# Patient Record
Sex: Male | Born: 2000 | Race: Black or African American | Hispanic: No | Marital: Single | Smoking: Never smoker
Health system: Southern US, Community
[De-identification: ages and names within clinical notes are randomized; demographics above are authoritative.]

---
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
## 2021-01-31 DIAGNOSIS — Z419 Encounter for procedure for purposes other than remedying health state, unspecified: Secondary | ICD-10-CM | POA: Diagnosis not present

## 2021-03-03 DIAGNOSIS — Z419 Encounter for procedure for purposes other than remedying health state, unspecified: Secondary | ICD-10-CM | POA: Diagnosis not present

## 2021-03-31 DIAGNOSIS — Z419 Encounter for procedure for purposes other than remedying health state, unspecified: Secondary | ICD-10-CM | POA: Diagnosis not present

## 2021-05-01 DIAGNOSIS — Z419 Encounter for procedure for purposes other than remedying health state, unspecified: Secondary | ICD-10-CM | POA: Diagnosis not present

## 2021-05-31 DIAGNOSIS — Z419 Encounter for procedure for purposes other than remedying health state, unspecified: Secondary | ICD-10-CM | POA: Diagnosis not present

## 2021-07-01 DIAGNOSIS — Z419 Encounter for procedure for purposes other than remedying health state, unspecified: Secondary | ICD-10-CM | POA: Diagnosis not present

## 2021-07-31 DIAGNOSIS — Z419 Encounter for procedure for purposes other than remedying health state, unspecified: Secondary | ICD-10-CM | POA: Diagnosis not present

## 2021-08-31 DIAGNOSIS — Z419 Encounter for procedure for purposes other than remedying health state, unspecified: Secondary | ICD-10-CM | POA: Diagnosis not present

## 2021-10-01 DIAGNOSIS — Z419 Encounter for procedure for purposes other than remedying health state, unspecified: Secondary | ICD-10-CM | POA: Diagnosis not present

## 2021-10-31 DIAGNOSIS — Z419 Encounter for procedure for purposes other than remedying health state, unspecified: Secondary | ICD-10-CM | POA: Diagnosis not present

## 2021-12-01 DIAGNOSIS — Z419 Encounter for procedure for purposes other than remedying health state, unspecified: Secondary | ICD-10-CM | POA: Diagnosis not present

## 2021-12-31 DIAGNOSIS — Z419 Encounter for procedure for purposes other than remedying health state, unspecified: Secondary | ICD-10-CM | POA: Diagnosis not present

## 2022-01-31 DIAGNOSIS — Z419 Encounter for procedure for purposes other than remedying health state, unspecified: Secondary | ICD-10-CM | POA: Diagnosis not present

## 2022-03-03 DIAGNOSIS — Z419 Encounter for procedure for purposes other than remedying health state, unspecified: Secondary | ICD-10-CM | POA: Diagnosis not present

## 2022-04-01 DIAGNOSIS — Z419 Encounter for procedure for purposes other than remedying health state, unspecified: Secondary | ICD-10-CM | POA: Diagnosis not present

## 2022-05-02 DIAGNOSIS — Z419 Encounter for procedure for purposes other than remedying health state, unspecified: Secondary | ICD-10-CM | POA: Diagnosis not present

## 2022-06-01 DIAGNOSIS — Z419 Encounter for procedure for purposes other than remedying health state, unspecified: Secondary | ICD-10-CM | POA: Diagnosis not present

## 2022-07-02 DIAGNOSIS — Z419 Encounter for procedure for purposes other than remedying health state, unspecified: Secondary | ICD-10-CM | POA: Diagnosis not present

## 2022-08-01 DIAGNOSIS — Z419 Encounter for procedure for purposes other than remedying health state, unspecified: Secondary | ICD-10-CM | POA: Diagnosis not present

## 2022-08-08 DIAGNOSIS — S0502XA Injury of conjunctiva and corneal abrasion without foreign body, left eye, initial encounter: Secondary | ICD-10-CM | POA: Diagnosis not present

## 2022-08-08 DIAGNOSIS — S0501XA Injury of conjunctiva and corneal abrasion without foreign body, right eye, initial encounter: Secondary | ICD-10-CM | POA: Diagnosis not present

## 2022-09-01 DIAGNOSIS — Z419 Encounter for procedure for purposes other than remedying health state, unspecified: Secondary | ICD-10-CM | POA: Diagnosis not present

## 2022-10-02 DIAGNOSIS — Z419 Encounter for procedure for purposes other than remedying health state, unspecified: Secondary | ICD-10-CM | POA: Diagnosis not present

## 2022-11-01 DIAGNOSIS — Z419 Encounter for procedure for purposes other than remedying health state, unspecified: Secondary | ICD-10-CM | POA: Diagnosis not present

## 2022-12-02 DIAGNOSIS — Z419 Encounter for procedure for purposes other than remedying health state, unspecified: Secondary | ICD-10-CM | POA: Diagnosis not present

## 2023-01-01 DIAGNOSIS — Z419 Encounter for procedure for purposes other than remedying health state, unspecified: Secondary | ICD-10-CM | POA: Diagnosis not present

## 2023-01-24 IMAGING — DX DG HAND COMPLETE 3+V*R*
3 series · 3 of 3 positions shown · non-contrast
Comparison: None.

CLINICAL DATA: Punched door last night. Hand pain and swelling.
Initial encounter.

EXAM:
RIGHT HAND - COMPLETE 3+ VIEW

[hand pa]
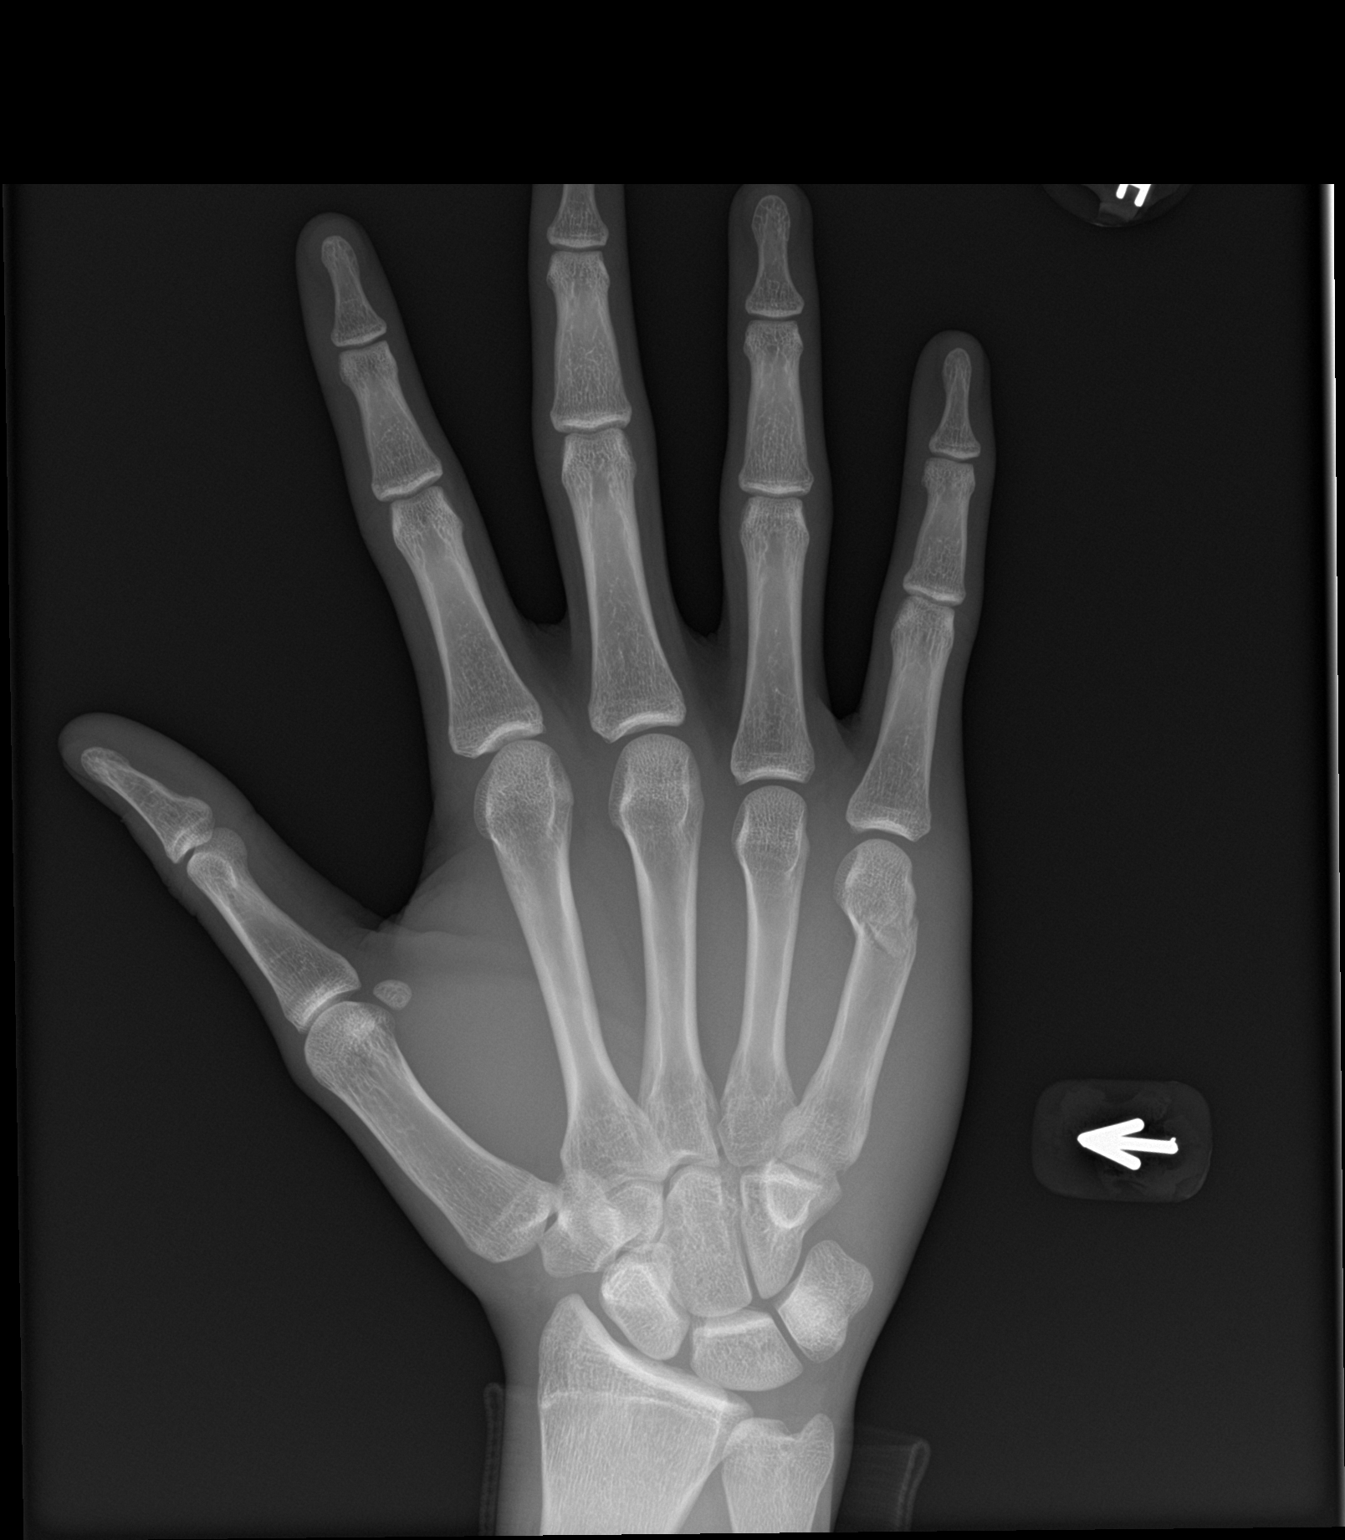

[hand obl]
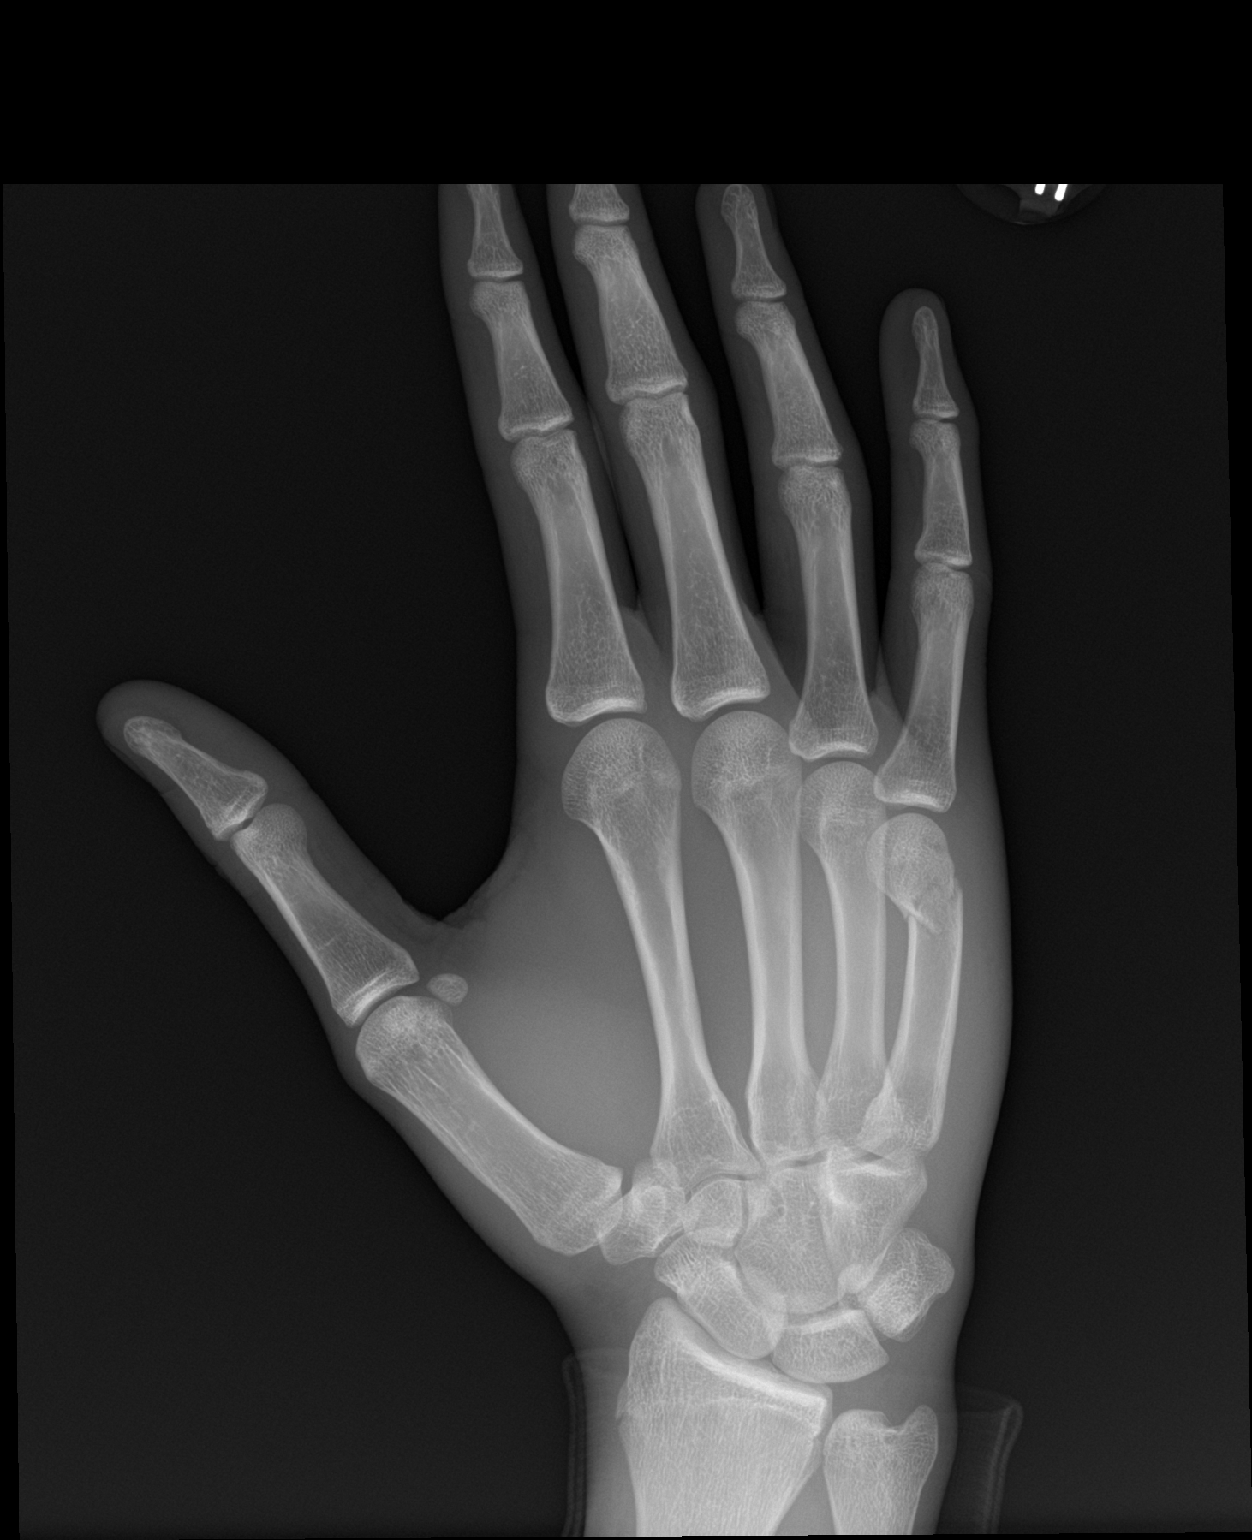

[hand lat]
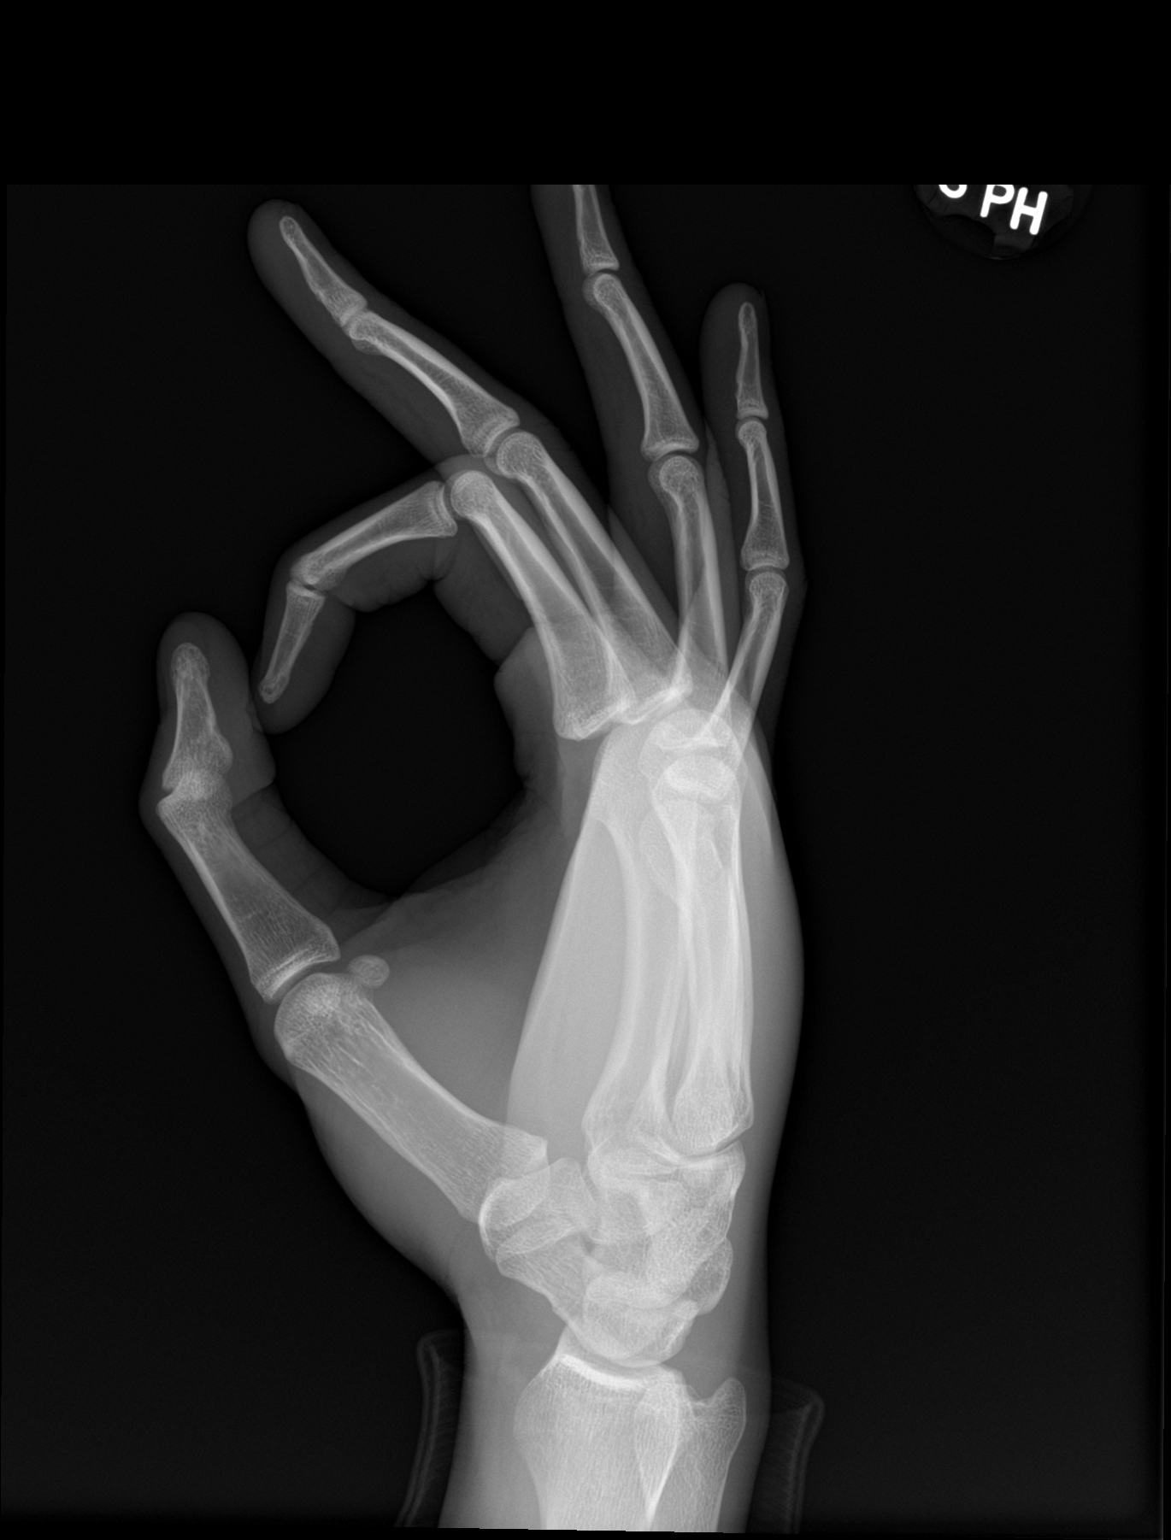

[3 of 3 positions shown; findings below may reference images not displayed]

FINDINGS: A comminuted, mildly displaced fracture of the distal 5th metacarpal
is seen, with mild volar and radial angulation of the distal
fracture fragment. No other fractures identified. No evidence of
dislocation.
IMPRESSION: Comminuted, mildly displaced fracture of the distal 5th metacarpal.

## 2023-04-12 DIAGNOSIS — R0782 Intercostal pain: Secondary | ICD-10-CM | POA: Diagnosis not present

## 2023-04-12 DIAGNOSIS — M5489 Other dorsalgia: Secondary | ICD-10-CM | POA: Diagnosis not present

## 2023-04-12 DIAGNOSIS — S29011A Strain of muscle and tendon of front wall of thorax, initial encounter: Secondary | ICD-10-CM | POA: Diagnosis not present

## 2023-04-12 DIAGNOSIS — S29012A Strain of muscle and tendon of back wall of thorax, initial encounter: Secondary | ICD-10-CM | POA: Diagnosis not present

## 2023-07-13 DIAGNOSIS — Z419 Encounter for procedure for purposes other than remedying health state, unspecified: Secondary | ICD-10-CM | POA: Diagnosis not present

## 2023-07-18 ENCOUNTER — Encounter (HOSPITAL_BASED_OUTPATIENT_CLINIC_OR_DEPARTMENT_OTHER): Payer: Self-pay | Admitting: Emergency Medicine

## 2023-07-18 ENCOUNTER — Emergency Department (HOSPITAL_BASED_OUTPATIENT_CLINIC_OR_DEPARTMENT_OTHER)

## 2023-07-18 ENCOUNTER — Emergency Department (HOSPITAL_BASED_OUTPATIENT_CLINIC_OR_DEPARTMENT_OTHER): Admission: EM | Admit: 2023-07-18 | Discharge: 2023-07-18 | Disposition: A | Discharge status: Home / Self Care

## 2023-07-18 ENCOUNTER — Other Ambulatory Visit: Payer: Self-pay

## 2023-07-18 DIAGNOSIS — R1032 Left lower quadrant pain: Secondary | ICD-10-CM | POA: Diagnosis not present

## 2023-07-18 DIAGNOSIS — R1031 Right lower quadrant pain: Secondary | ICD-10-CM | POA: Insufficient documentation

## 2023-07-18 DIAGNOSIS — R0781 Pleurodynia: Secondary | ICD-10-CM | POA: Diagnosis not present

## 2023-07-18 DIAGNOSIS — R7401 Elevation of levels of liver transaminase levels: Secondary | ICD-10-CM | POA: Insufficient documentation

## 2023-07-18 DIAGNOSIS — R1030 Lower abdominal pain, unspecified: Secondary | ICD-10-CM

## 2023-07-18 LAB — COMPREHENSIVE METABOLIC PANEL WITH GFR
ALT: 68 U/L — ABNORMAL HIGH (ref 0–44)
AST: 63 U/L — ABNORMAL HIGH (ref 15–41)
Albumin: 4.7 g/dL (ref 3.5–5.0)
Alkaline Phosphatase: 76 U/L (ref 38–126)
Anion gap: 12 (ref 5–15)
BUN: 16 mg/dL (ref 6–20)
CO2: 24 mmol/L (ref 22–32)
Calcium: 9.3 mg/dL (ref 8.9–10.3)
Chloride: 103 mmol/L (ref 98–111)
Creatinine, Ser: 1.1 mg/dL (ref 0.61–1.24)
GFR, Estimated: >60 mL/min (ref >60)
Glucose, Bld: 111 mg/dL — ABNORMAL HIGH (ref 70–99)
Potassium: 3.8 mmol/L (ref 3.5–5.1)
Sodium: 139 mmol/L (ref 135–145)
Total Bilirubin: 0.3 mg/dL (ref 0.0–1.2)
Total Protein: 7.3 g/dL (ref 6.5–8.1)

## 2023-07-18 LAB — CBC WITH DIFFERENTIAL/PLATELET
Abs Immature Granulocytes: 0.01 10*3/uL (ref 0.00–0.07)
Basophils Absolute: 0 10*3/uL (ref 0.0–0.1)
Basophils Relative: 1 %
Eosinophils Absolute: 0.3 10*3/uL (ref 0.0–0.5)
Eosinophils Relative: 6 %
HCT: 43.7 % (ref 39.0–52.0)
Hemoglobin: 15 g/dL (ref 13.0–17.0)
Immature Granulocytes: 0 %
Lymphocytes Relative: 39 %
Lymphs Abs: 2.3 10*3/uL (ref 0.7–4.0)
MCH: 30.2 pg (ref 26.0–34.0)
MCHC: 34.3 g/dL (ref 30.0–36.0)
MCV: 87.9 fL (ref 80.0–100.0)
Monocytes Absolute: 0.5 10*3/uL (ref 0.1–1.0)
Monocytes Relative: 9 %
Neutro Abs: 2.7 10*3/uL (ref 1.7–7.7)
Neutrophils Relative %: 45 %
Platelets: 197 10*3/uL (ref 150–400)
RBC: 4.97 MIL/uL (ref 4.22–5.81)
RDW: 11.9 % (ref 11.5–15.5)
WBC: 5.8 10*3/uL (ref 4.0–10.5)
nRBC: 0 % (ref 0.0–0.2)

## 2023-07-18 LAB — URINALYSIS, MICROSCOPIC (REFLEX): WBC, UA: NONE SEEN WBC/hpf (ref 0–5)

## 2023-07-18 LAB — URINALYSIS, ROUTINE W REFLEX MICROSCOPIC
Bilirubin Urine: NEGATIVE
Glucose, UA: NEGATIVE mg/dL
Hgb urine dipstick: NEGATIVE
Ketones, ur: NEGATIVE mg/dL
Leukocytes,Ua: NEGATIVE
Nitrite: NEGATIVE
Protein, ur: 30 mg/dL — AB
Specific Gravity, Urine: 1.025 (ref 1.005–1.030)
pH: 7 (ref 5.0–8.0)

## 2023-07-18 LAB — LIPASE, BLOOD: Lipase: 31 U/L (ref 11–51)

## 2023-07-18 NOTE — Discharge Instructions (Addendum)
 Your workup is reassuring today.  Your kidney and pancreas labs were normal.  Your electrolytes were normal.  Your urine did not show any signs of infection.  You did have mild elevations in your AST and ALT which are your liver enzymes.  Please have these repeated with your primary care doctor within the next month for further evaluation.  Your chest x-ray was normal today.  There were no signs of infection, collapsed lung, or rib fracture.  You may take up to 1000mg  of tylenol  every 6 hours as needed for pain.  Do not take more then 4g per day.  You may use up to 600mg  ibuprofen  every 6 hours as needed for pain.  Do not exceed 2.4g of ibuprofen  per day.  Please return to the ER if you develop worsening abdominal pain, vomiting, fevers, testicular pain, any other new or concerning symptoms.

## 2023-07-18 NOTE — ED Provider Notes (Signed)
 [place] [facility] Provider Note   CSN: [id number] Arrival date & time: 07/18/23  1914     Patient presents with: Abdominal Pain   Peter Silva is a 23 y.o. male with no significant past medical history presents with concern for left sided rib pain that started earlier today that was accompanied with some shortness of breath.  States this is sudden onset, and then pain started to spread more into the lower abdomen.  He is no longer having rib pain or shortness of breath here in the emergency room.  Denies any injuries to the area.  Denies any nausea or vomiting, fever or chills.  Denies any dysuria, hematuria, increased frequency, or concern for STIs.  Reports normal bowel movements.  He does report a similar episode of left-sided rib pain that happened a couple weeks ago, but resolved on its own.  This was also accompanied with some left testicle pain that also resolved.  He does report some testicular pain again here today.    Abdominal Pain      Prior to Admission medications   Not on File    Allergies: Patient has no known allergies.    Review of Systems  Gastrointestinal:  Positive for abdominal pain.    Updated Vital Signs BP (!) 112/53 (BP Location: Left Arm)   Pulse 83   Temp 97.6 F (36.4 C)   Resp 20   Ht 6' (1.829 m)   Wt 74.8 kg   SpO2 100%   BMI 22.38 kg/m   Physical Exam Vitals and nursing note reviewed. Exam conducted with a chaperone present.  Constitutional:      General: He is not in acute distress.    Appearance: He is well-developed.  HENT:     Head: Normocephalic and atraumatic.   Eyes:     Conjunctiva/sclera: Conjunctivae normal.    Cardiovascular:     Rate and Rhythm: Normal rate and regular rhythm.     Heart sounds: No murmur heard. Pulmonary:     Effort: Pulmonary effort is normal. No respiratory distress.     Breath sounds: Normal breath sounds.  Abdominal:     Palpations: Abdomen is  soft.     Tenderness: There is abdominal tenderness.     Comments: Mild tenderness to palpation in the right lower quadrant and left lower quadrant.  No rebound or guarding  Genitourinary:    Penis: Normal.      Testes: Normal. Cremasteric reflex is present.        Right: Mass, tenderness or swelling not present.        Left: Mass, tenderness or swelling not present.     Comments: No overlying skin changes in the scrotum.  No penile lesions or penile discharge.  No high riding testicle.  No testicular tenderness to palpation  Musculoskeletal:        General: No swelling.     Cervical back: Neck supple.   Skin:    General: Skin is warm and dry.     Capillary Refill: Capillary refill takes less than 2 seconds.   Neurological:     Mental Status: He is alert.   Psychiatric:        Mood and Affect: Mood normal.     (all labs ordered are listed, but only abnormal results are displayed) Labs Reviewed  COMPREHENSIVE METABOLIC PANEL WITH GFR - Abnormal; Notable for the following components:      Result Value   Glucose,  Bld 111 (*)    AST 63 (*)    ALT 68 (*)    All other components within normal limits  URINALYSIS, ROUTINE W REFLEX MICROSCOPIC - Abnormal; Notable for the following components:   APPearance CLOUDY (*)    Protein, ur 30 (*)    All other components within normal limits  URINALYSIS, MICROSCOPIC (REFLEX) - Abnormal; Notable for the following components:   Bacteria, UA RARE (*)    All other components within normal limits  CBC WITH DIFFERENTIAL/PLATELET  LIPASE, BLOOD    EKG: None  Radiology: DG Chest 2 View Result Date: 07/18/2023 CLINICAL DATA:  Lower left rib pain x2 hours. EXAM: CHEST - 2 VIEW COMPARISON:  December 06, 2016 FINDINGS: The heart size and mediastinal contours are within normal limits. Both lungs are clear. The visualized skeletal structures are unremarkable. IMPRESSION: No active cardiopulmonary disease. Electronically Signed   By: Virgle Grime  M.D.   On: 07/18/2023 20:30     Procedures   Medications Ordered in the ED - No data to display                                  Medical Decision Making Amount and/or Complexity of Data Reviewed Labs: ordered. Radiology: ordered.     Differential diagnosis includes but is not limited to pneumothorax, pneumonia, rib fracture, cholelithiasis, cholangitis, choledocholithiasis, peptic ulcer, gastritis, gastroenteritis, appendicitis, IBS, IBD, DKA, nephrolithiasis, UTI, STIs, epididymitis, pyelonephritis, pancreatitis, diverticulitis, mesenteric ischemia, abdominal aortic aneurysm, small bowel obstruction, volvulus, testicular torsion in males  ED Course:  Upon initial evaluation, patient is well appearing, no acute distress. Stable vitals. Moving around comfortably. He does have some mild lower abdominal tenderness palpation without rebound or guarding.  Although he did report some testicular pain, but testicles nontender on palpation.  No scrotal edema or erythema.  No penile lesions or discharge. I have very low clinical concern for epididymitis or testicular torsion at this time.  He declines any pain medicine at this time.  Labs Ordered: I Ordered, and personally interpreted labs.  The pertinent results include:   CBC within normal limits CMP with mild elevation in AST and ALT at 63 and 68 respectively.  No elevation in creatinine, alk phos, bilirubin.  Normal electrolytes Lipase within normal limits Urinalysis with no nitrates or leukocytes  Imaging Studies ordered: I ordered imaging studies including chest x-ray I independently visualized the imaging with scope of interpretation limited to determining acute life threatening conditions related to emergency care. Imaging showed no acute abnormalities I agree with the radiologist interpretation   Medications Given: None  Upon re-evaluation, patient still well-appearing with stable vitals.  Reports he still has some abdominal  pain, but still does not feel like he needs any pain medication.  We discussed that his labs were overall unremarkable.  Chest x-ray imaging unremarkable.  I had a shared decision-making conversation with the patient regarding obtaining further CT abdomen pelvis imaging versus watchful waiting at home.  Patient would like to hold off on CT abdomen and pelvis imaging at this time.  He states that he would like to monitor the pain at home and will return if there are any new or worsening symptoms.  I feel this is reasonable at this time given no significant abdominal tenderness palpation on exam, stable vitals, and unremarkable labs, low concern for acute intra-abdominal pathology.  He reports he still having bowel movements and passing gas,  no concern for SBO.  No concern for testicular torsion given pain has been ongoing for a couple hours, and patient very well-appearing, moving around comfortably, and no testicular tenderness on exam, no high riding testicle.  He is denying any chest pain or shortness of breath currently, I have very low concern for cardiac etiology as to pain at this time.  Feel he is stable and appropriate for discharge home    Impression: Lower abdominal pain  Disposition:  The patient was discharged home with instructions to take Tylenol  and ibuprofen  as needed for pain.  Follow-up with PCP within the next month for recheck of AST and ALT. Return precautions given.    This chart was dictated using voice recognition software, Dragon. Despite the best efforts of this provider to proofread and correct errors, errors may still occur which can change documentation meaning.       Final diagnoses:  Lower abdominal pain    ED Discharge Orders     None          Rexie Catena, PA-C 07/18/23 2200    Rolinda Climes, DO 07/18/23 2319

## 2023-07-18 NOTE — ED Triage Notes (Signed)
 Pt via POV c/o left lower rib pain x 2 hours with no other symptoms. Pain rated 6/10 constant. NAD in triage.

## 2023-08-12 DIAGNOSIS — Z419 Encounter for procedure for purposes other than remedying health state, unspecified: Secondary | ICD-10-CM | POA: Diagnosis not present

## 2023-09-12 DIAGNOSIS — Z419 Encounter for procedure for purposes other than remedying health state, unspecified: Secondary | ICD-10-CM | POA: Diagnosis not present

## 2023-10-13 DIAGNOSIS — Z419 Encounter for procedure for purposes other than remedying health state, unspecified: Secondary | ICD-10-CM | POA: Diagnosis not present

## 2023-11-12 DIAGNOSIS — Z419 Encounter for procedure for purposes other than remedying health state, unspecified: Secondary | ICD-10-CM | POA: Diagnosis not present
